# Patient Record
Sex: Female | Born: 1960 | Race: White | Hispanic: No | Marital: Married | Smoking: Never smoker
Health system: Southern US, Community
[De-identification: ages and names within clinical notes are randomized; demographics above are authoritative.]

## PROBLEM LIST (undated history)

## (undated) DIAGNOSIS — D49519 Neoplasm of unspecified behavior of unspecified kidney: Secondary | ICD-10-CM

## (undated) DIAGNOSIS — F419 Anxiety disorder, unspecified: Secondary | ICD-10-CM

---

## 2014-03-29 ENCOUNTER — Emergency Department (HOSPITAL_COMMUNITY)
Admission: EM | Admit: 2014-03-29 | Discharge: 2014-03-29 | Disposition: A | Discharge status: Home / Self Care | Payer: BLUE CROSS/BLUE SHIELD | Attending: Emergency Medicine | Admitting: Emergency Medicine

## 2014-03-29 ENCOUNTER — Emergency Department (HOSPITAL_COMMUNITY): Payer: BLUE CROSS/BLUE SHIELD

## 2014-03-29 ENCOUNTER — Encounter (HOSPITAL_COMMUNITY): Payer: Self-pay | Admitting: Neurology

## 2014-03-29 DIAGNOSIS — F419 Anxiety disorder, unspecified: Secondary | ICD-10-CM | POA: Diagnosis not present

## 2014-03-29 DIAGNOSIS — R202 Paresthesia of skin: Secondary | ICD-10-CM | POA: Diagnosis present

## 2014-03-29 DIAGNOSIS — R079 Chest pain, unspecified: Secondary | ICD-10-CM | POA: Insufficient documentation

## 2014-03-29 DIAGNOSIS — Z85528 Personal history of other malignant neoplasm of kidney: Secondary | ICD-10-CM | POA: Insufficient documentation

## 2014-03-29 HISTORY — DX: Neoplasm of unspecified behavior of unspecified kidney: D49.519

## 2014-03-29 HISTORY — DX: Anxiety disorder, unspecified: F41.9

## 2014-03-29 LAB — BASIC METABOLIC PANEL
ANION GAP: 8 (ref 5–15)
BUN: 15 mg/dL (ref 6–23)
CALCIUM: 9.1 mg/dL (ref 8.4–10.5)
CHLORIDE: 106 mmol/L (ref 96–112)
CO2: 25 mmol/L (ref 19–32)
Creatinine, Ser: 0.81 mg/dL (ref 0.50–1.10)
GFR calc Af Amer: >90 mL/min (ref >90)
GFR, EST NON AFRICAN AMERICAN: 81 mL/min — AB (ref >90)
Glucose, Bld: 106 mg/dL — ABNORMAL HIGH (ref 70–99)
Potassium: 3.5 mmol/L (ref 3.5–5.1)
Sodium: 139 mmol/L (ref 135–145)

## 2014-03-29 LAB — I-STAT TROPONIN, ED
Troponin i, poc: 0 ng/mL (ref 0.00–0.08)
Troponin i, poc: 0 ng/mL (ref 0.00–0.08)

## 2014-03-29 LAB — CBC
HCT: 41.6 % (ref 36.0–46.0)
Hemoglobin: 13.9 g/dL (ref 12.0–15.0)
MCH: 31 pg (ref 26.0–34.0)
MCHC: 33.4 g/dL (ref 30.0–36.0)
MCV: 92.9 fL (ref 78.0–100.0)
PLATELETS: 359 10*3/uL (ref 150–400)
RBC: 4.48 MIL/uL (ref 3.87–5.11)
RDW: 13.7 % (ref 11.5–15.5)
WBC: 10.6 10*3/uL — ABNORMAL HIGH (ref 4.0–10.5)

## 2014-03-29 MED ORDER — LORAZEPAM 1 MG PO TABS
1.0000 mg | ORAL_TABLET | Freq: Once | ORAL | Status: AC
  Administered 2014-03-29: 1 mg via ORAL
  Filled 2014-03-29: qty 1

## 2014-03-29 NOTE — ED Notes (Signed)
EDP okay for pt to eat.

## 2014-03-29 NOTE — ED Notes (Signed)
Pt c/o numbness/tingling in hands and feet and "slight headache." Pt has previously experienced these symptoms with no determined cause. Able to move all extremities. No other symptoms.

## 2014-03-29 NOTE — ED Notes (Addendum)
Pt reports she was visiting her husband in ICU, developed numbness in right arm and tingling in arms and legs. 1/10 cp under left breast. Denies pain at current. Pt is a x 4. Reports she felt sweaty and nauseated when it occurred. States it could have been anxiety. Pt calm and cooperative

## 2014-03-29 NOTE — Discharge Instructions (Signed)

## 2014-03-29 NOTE — ED Notes (Signed)
Pt ambulated with steady gait to restroom ?

## 2014-03-29 NOTE — ED Provider Notes (Signed)
CSN: [id number]     Arrival date & time 03/29/14  1244 History   First MD Initiated Contact with Patient 03/29/14 1600     Chief Complaint  Patient presents with  . Tingling     (Consider location/radiation/quality/duration/timing/severity/associated sxs/prior Treatment) HPI Comments: Patient with past medical history remarkable for anxiety, presents emergency department with chief complaint of numbness and tingling to upper and lower extremities. She states that the symptoms started a couple of days ago when she found out that her husband had a massive heart attack. She states that she flew to [place] from [place] to be with him. She states that she was visiting her husband today in the ICU, when she began to have the symptoms again. She states that she has had these symptoms in the past when she gets nervous. She reports one brief episode of chest pain which was sharp in nature, but was also fleeting. She denies any repeat episodes chest pain. She states that she felt like it was hard to catch her breath, but denies shortness of breath. She denies any fevers, chills, cough, vomiting, or diarrhea. She does report having some associated nausea. She states that for the most part her symptoms have resolved in the emergency department. She denies any cardiac risk factors. She has never had a DVT or PE.  The history is provided by the patient. No language interpreter was used.    Past Medical History  Diagnosis Date  . Anxiety   . Kidney tumor    History reviewed. No pertinent past surgical history. No family history on file. History  Substance Use Topics  . Smoking status: Never Smoker   . Smokeless tobacco: Not on file  . Alcohol Use: No   OB History    No data available     Review of Systems  Constitutional: Negative for fever and chills.  Respiratory: Negative for shortness of breath.   Cardiovascular: Positive for chest pain.  Gastrointestinal: Negative for nausea,  vomiting, diarrhea and constipation.  Genitourinary: Negative for dysuria.  Neurological: Positive for numbness.  All other systems reviewed and are negative.     Allergies  Codeine  Home Medications   Prior to Admission medications   Not on File   BP 150/75 mmHg  Pulse 75  Temp(Src) 97.4 F (36.3 C) (Oral)  Resp 15  SpO2 98% Physical Exam  Constitutional: She is oriented to person, place, and time. She appears well-developed and well-nourished.  HENT:  Head: Normocephalic and atraumatic.  Eyes: Conjunctivae and EOM are normal. Pupils are equal, round, and reactive to light.  Neck: Normal range of motion. Neck supple.  Cardiovascular: Normal rate and regular rhythm.  Exam reveals no gallop and no friction rub.   No murmur heard. Pulmonary/Chest: Effort normal and breath sounds normal. No respiratory distress. She has no wheezes. She has no rales. She exhibits no tenderness.  Abdominal: Soft. Bowel sounds are normal. She exhibits no distension and no mass. There is no tenderness. There is no rebound and no guarding.  Musculoskeletal: Normal range of motion. She exhibits no edema or tenderness.  Neurological: She is alert and oriented to person, place, and time.  Skin: Skin is warm and dry.  Psychiatric: She has a normal mood and affect. Her behavior is normal. Judgment and thought content normal.  Nursing note and vitals reviewed.   ED Course  Procedures (including critical care time) Results for orders placed or performed during the hospital encounter of 03/29/14  CBC  Result Value Ref Range   WBC 10.6 (H) 4.0 - 10.5 K/uL   RBC 4.48 3.87 - 5.11 MIL/uL   Hemoglobin 13.9 12.0 - 15.0 g/dL   HCT 41.6 36.0 - 46.0 %   MCV 92.9 78.0 - 100.0 fL   MCH 31.0 26.0 - 34.0 pg   MCHC 33.4 30.0 - 36.0 g/dL   RDW 13.7 11.5 - 15.5 %   Platelets 359 150 - 400 K/uL  Basic metabolic panel  Result Value Ref Range   Sodium 139 135 - 145 mmol/L   Potassium 3.5 3.5 - 5.1 mmol/L    Chloride 106 96 - 112 mmol/L   CO2 25 19 - 32 mmol/L   Glucose, Bld 106 (H) 70 - 99 mg/dL   BUN 15 6 - 23 mg/dL   Creatinine, Ser 0.81 0.50 - 1.10 mg/dL   Calcium 9.1 8.4 - 10.5 mg/dL   GFR calc non Af Amer 81 (L) >90 mL/min   GFR calc Af Amer >90 >90 mL/min   Anion gap 8 5 - 15  I-stat troponin, ED (not at [facility])  Result Value Ref Range   Troponin i, poc 0.00 0.00 - 0.08 ng/mL   Comment 3          I-stat troponin, ED  Result Value Ref Range   Troponin i, poc 0.00 0.00 - 0.08 ng/mL   Comment 3           Dg Chest 2 View  03/29/2014   CLINICAL DATA:  Chest pain on the left below the breast with tingling in the left arm for 1 day.  EXAM: CHEST  2 VIEW  COMPARISON:  None.  FINDINGS: Heart size and mediastinal contours are within normal limits. Both lungs are clear. Visualized skeletal structures are unremarkable.  IMPRESSION: Negative exam.   Electronically Signed   By: Inge Rise M.D.   On: 03/29/2014 13:51     Imaging Review Dg Chest 2 View  03/29/2014   CLINICAL DATA:  Chest pain on the left below the breast with tingling in the left arm for 1 day.  EXAM: CHEST  2 VIEW  COMPARISON:  None.  FINDINGS: Heart size and mediastinal contours are within normal limits. Both lungs are clear. Visualized skeletal structures are unremarkable.  IMPRESSION: Negative exam.   Electronically Signed   By: Inge Rise M.D.   On: 03/29/2014 13:51     EKG Interpretation None      ED ECG REPORT  I personally interpreted this EKG   Date: 03/29/2014   Rate: 75  Rhythm: sinus arrhythmia  QRS Axis: normal  Intervals: normal  ST/T Wave abnormalities: normal  Conduction Disutrbances:none  Narrative Interpretation:   Old EKG Reviewed: none available and unchanged    MDM   Final diagnoses:  Chest pain, unspecified chest pain type  Anxiety    Patient is well-appearing emergency department. She complains of some intermittent numbness and tingling to her extremities which is precipitated by  stress and anxiety. She had one episode of sharp chest pain which has resolved. She denies any exertional symptoms. No evidence of DVT or PE. Patient is not hypoxic nor tachycardic. She has no lower extremity edema or calf tenderness.  Wells PE criteria is 0,  Wells DVT criteria is 0, heart score is 2. Low risk for ACS or PE/DVT. Will give a dose of Ativan, as I believe this to be anxiety. Patient agrees that she has felt this way when she is anxious in  the past. Anticipate discharge to home with primary care follow-up.  6:14 PM Patient reassessed. She states that her symptoms have resolved, she is feeling much better. Delta troponin is pending. If negative, discharged home per plan above.    Montine Circle, PA-C 03/29/14 1900  Ernestina Patches, MD 03/30/14 [phone]

## 2014-03-29 NOTE — ED Notes (Signed)
Patient was given a [place] sandwich bag.

## 2014-03-29 NOTE — ED Notes (Signed)
Patient called to go back to room x 3.   No answer.

## 2016-08-13 IMAGING — DX DG CHEST 2V
2 series · 2 of 2 positions shown · non-contrast
Comparison: None.

CLINICAL DATA: Chest pain on the left below the breast with
tingling in the left arm for 1 day.

EXAM:
CHEST  2 VIEW

[chest pa]
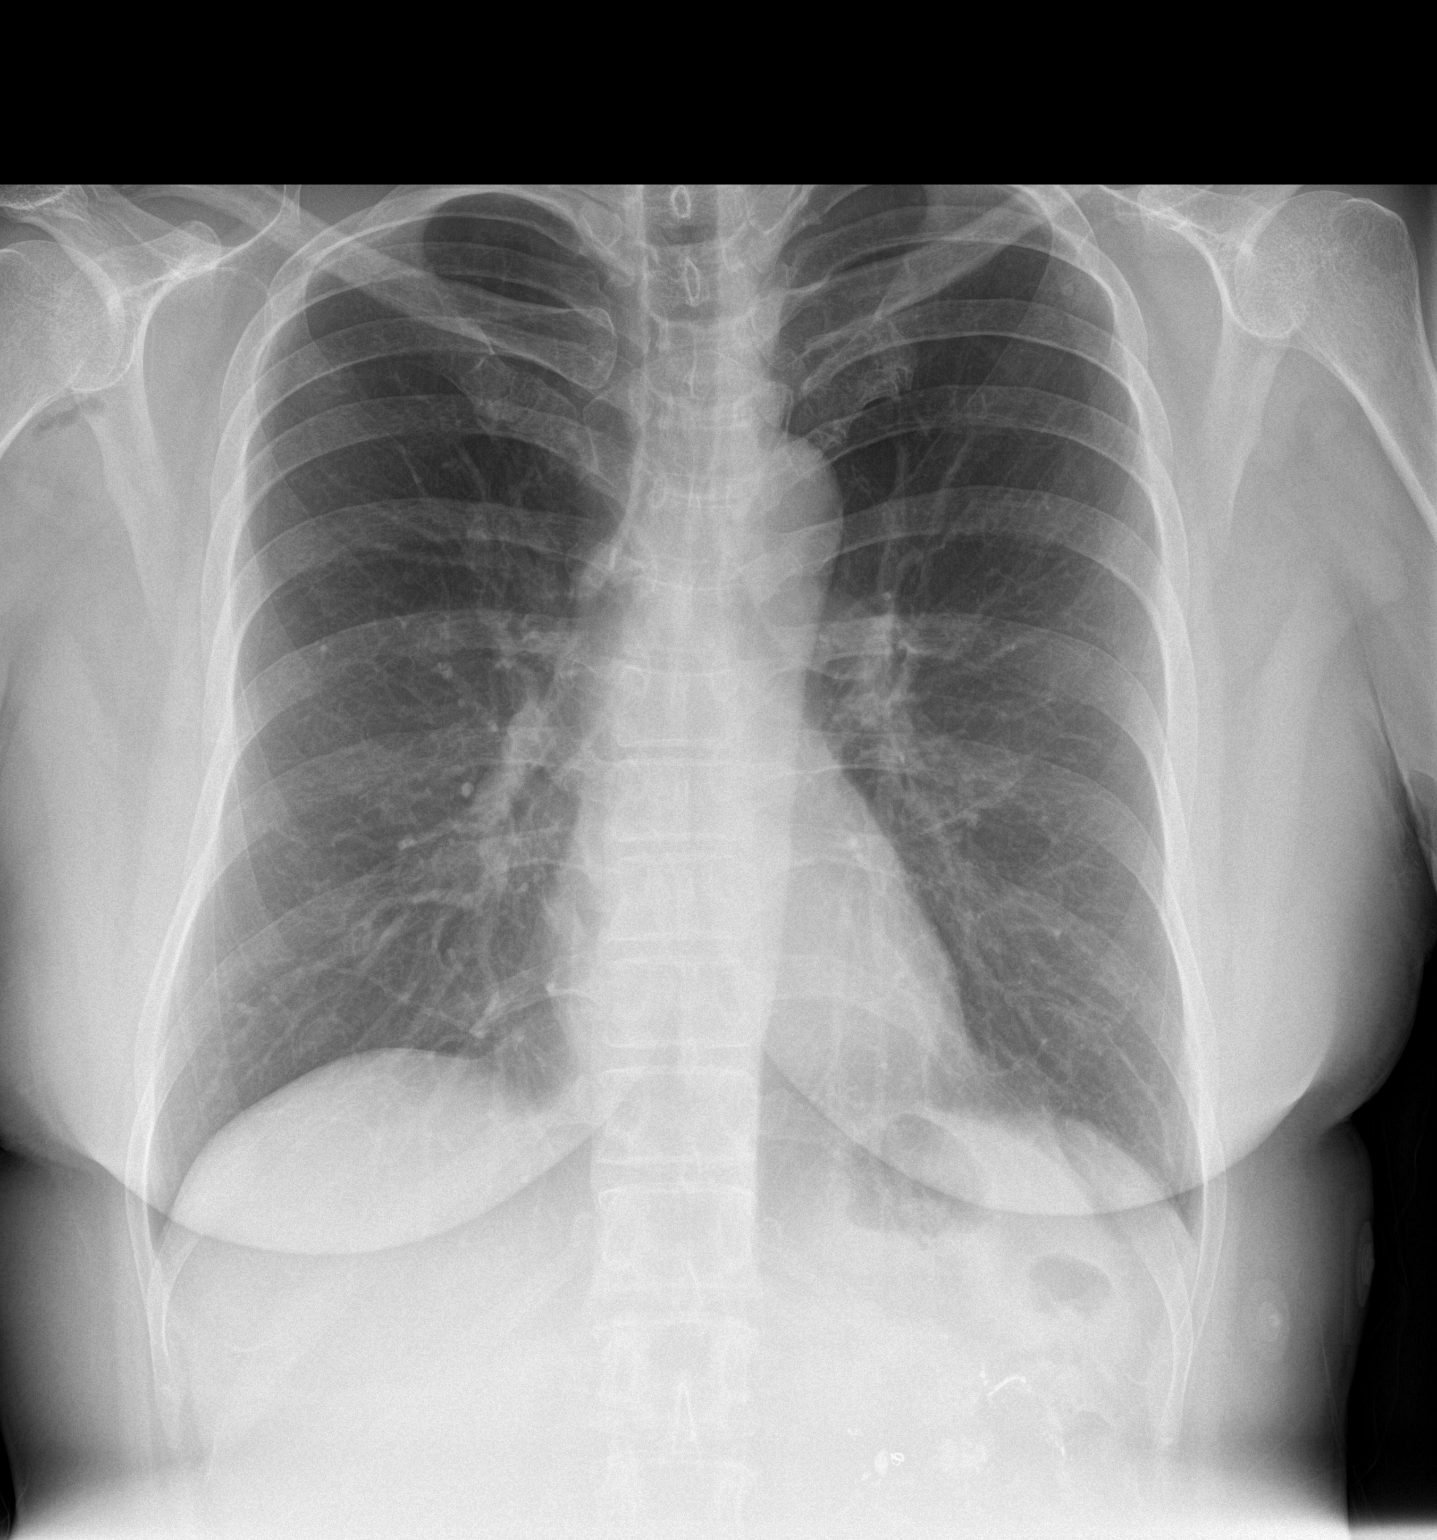

[chest lat]
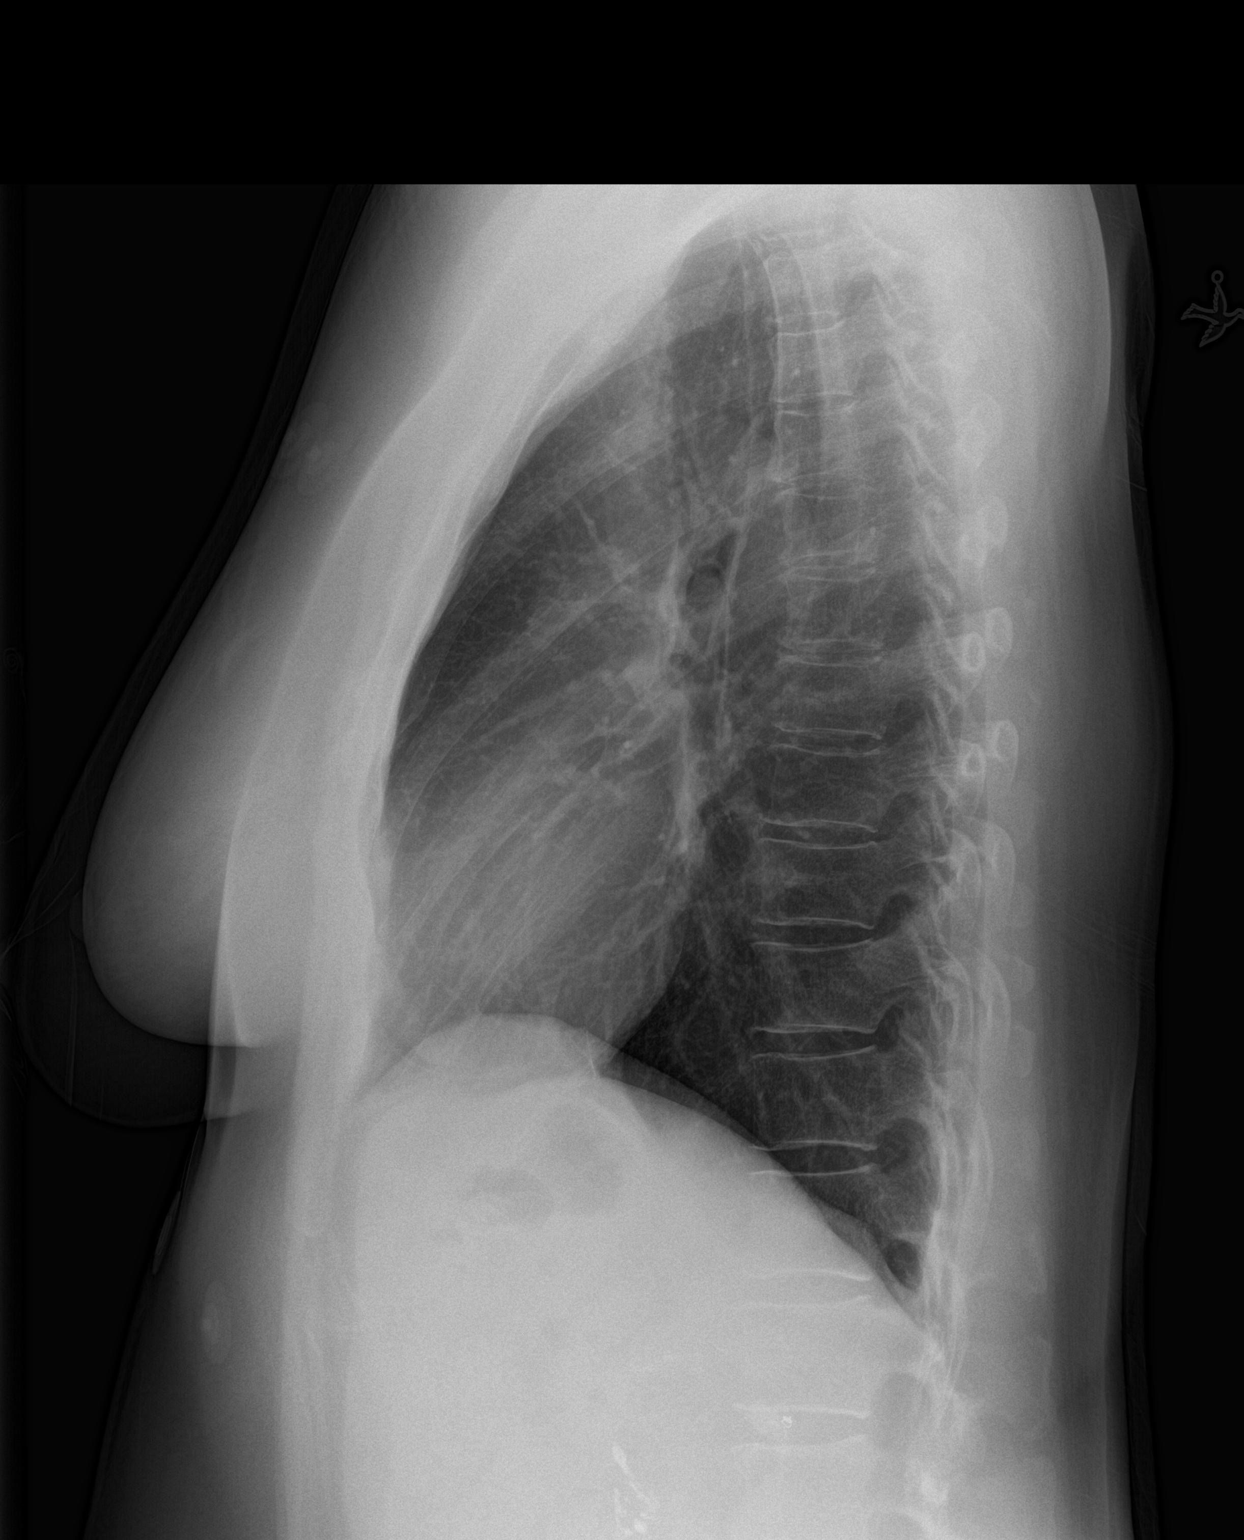

[2 of 2 positions shown; findings below may reference images not displayed]

FINDINGS: Heart size and mediastinal contours are within normal limits. Both
lungs are clear. Visualized skeletal structures are unremarkable.
IMPRESSION: Negative exam.
